# Patient Record
Sex: Female | Born: 1935 | Race: White | Hispanic: No | Marital: Married | Smoking: Never smoker
Health system: Southern US, Community
[De-identification: ages and names within clinical notes are randomized; demographics above are authoritative.]

## PROBLEM LIST (undated history)

## (undated) DIAGNOSIS — E7800 Pure hypercholesterolemia, unspecified: Secondary | ICD-10-CM

## (undated) DIAGNOSIS — I10 Essential (primary) hypertension: Secondary | ICD-10-CM

## (undated) DIAGNOSIS — H8109 Meniere's disease, unspecified ear: Secondary | ICD-10-CM

## (undated) DIAGNOSIS — H579 Unspecified disorder of eye and adnexa: Secondary | ICD-10-CM

## (undated) HISTORY — PX: ABDOMINAL HYSTERECTOMY: SHX81

## (undated) HISTORY — PX: THROAT SURGERY: SHX803

---

## 1997-10-15 ENCOUNTER — Other Ambulatory Visit: Admission: RE | Admit: 1997-10-15 | Discharge: 1997-10-15 | Payer: Self-pay | Admitting: Obstetrics and Gynecology

## 1999-05-16 ENCOUNTER — Encounter: Payer: Self-pay | Admitting: Orthopedic Surgery

## 1999-05-16 ENCOUNTER — Ambulatory Visit (HOSPITAL_COMMUNITY): Admission: RE | Admit: 1999-05-16 | Discharge: 1999-05-16 | Payer: Self-pay | Admitting: Orthopedic Surgery

## 1999-05-24 ENCOUNTER — Encounter: Payer: Self-pay | Admitting: Orthopedic Surgery

## 1999-05-24 ENCOUNTER — Ambulatory Visit (HOSPITAL_COMMUNITY): Admission: RE | Admit: 1999-05-24 | Discharge: 1999-05-24 | Payer: Self-pay | Admitting: Orthopedic Surgery

## 1999-06-07 ENCOUNTER — Encounter: Payer: Self-pay | Admitting: Orthopedic Surgery

## 1999-06-07 ENCOUNTER — Ambulatory Visit (HOSPITAL_COMMUNITY): Admission: RE | Admit: 1999-06-07 | Discharge: 1999-06-07 | Payer: Self-pay | Admitting: Orthopedic Surgery

## 1999-06-21 ENCOUNTER — Ambulatory Visit (HOSPITAL_COMMUNITY): Admission: RE | Admit: 1999-06-21 | Discharge: 1999-06-21 | Payer: Self-pay | Admitting: Orthopedic Surgery

## 1999-06-21 ENCOUNTER — Encounter: Payer: Self-pay | Admitting: Orthopedic Surgery

## 2000-02-10 ENCOUNTER — Other Ambulatory Visit: Admission: RE | Admit: 2000-02-10 | Discharge: 2000-02-10 | Payer: Self-pay | Admitting: Obstetrics and Gynecology

## 2001-11-04 ENCOUNTER — Encounter: Admission: RE | Admit: 2001-11-04 | Discharge: 2001-11-04 | Payer: Self-pay | Admitting: Internal Medicine

## 2001-11-04 ENCOUNTER — Encounter: Payer: Self-pay | Admitting: Internal Medicine

## 2001-11-18 ENCOUNTER — Encounter: Admission: RE | Admit: 2001-11-18 | Discharge: 2001-11-18 | Payer: Self-pay | Admitting: Internal Medicine

## 2001-11-18 ENCOUNTER — Encounter: Payer: Self-pay | Admitting: Internal Medicine

## 2003-02-23 ENCOUNTER — Ambulatory Visit (HOSPITAL_COMMUNITY): Admission: RE | Admit: 2003-02-23 | Discharge: 2003-02-23 | Payer: Self-pay | Admitting: Gastroenterology

## 2003-11-03 ENCOUNTER — Ambulatory Visit (HOSPITAL_COMMUNITY): Admission: RE | Admit: 2003-11-03 | Discharge: 2003-11-03 | Payer: Self-pay | Admitting: Internal Medicine

## 2005-12-23 ENCOUNTER — Emergency Department (HOSPITAL_COMMUNITY): Admission: EM | Admit: 2005-12-23 | Discharge: 2005-12-24 | Payer: Self-pay | Admitting: Emergency Medicine

## 2007-11-25 ENCOUNTER — Emergency Department (HOSPITAL_COMMUNITY): Admission: EM | Admit: 2007-11-25 | Discharge: 2007-11-25 | Payer: Self-pay | Admitting: Family Medicine

## 2008-08-03 ENCOUNTER — Emergency Department (HOSPITAL_COMMUNITY): Admission: EM | Admit: 2008-08-03 | Discharge: 2008-08-04 | Payer: Self-pay | Admitting: Emergency Medicine

## 2009-06-17 ENCOUNTER — Emergency Department (HOSPITAL_COMMUNITY): Admission: EM | Admit: 2009-06-17 | Discharge: 2009-06-18 | Payer: Self-pay | Admitting: Emergency Medicine

## 2010-04-04 LAB — DIFFERENTIAL
Basophils Relative: 0 % (ref 0–1)
Eosinophils Absolute: 0 10*3/uL (ref 0.0–0.7)
Eosinophils Relative: 0 % (ref 0–5)
Lymphs Abs: 1.4 10*3/uL (ref 0.7–4.0)
Monocytes Relative: 2 % — ABNORMAL LOW (ref 3–12)
Neutrophils Relative %: 86 % — ABNORMAL HIGH (ref 43–77)

## 2010-04-04 LAB — COMPREHENSIVE METABOLIC PANEL
ALT: 29 U/L (ref 0–35)
AST: 34 U/L (ref 0–37)
Alkaline Phosphatase: 83 U/L (ref 39–117)
CO2: 20 mEq/L (ref 19–32)
Calcium: 9 mg/dL (ref 8.4–10.5)
GFR calc Af Amer: 33 mL/min — ABNORMAL LOW (ref >60)
GFR calc non Af Amer: 27 mL/min — ABNORMAL LOW (ref >60)
Glucose, Bld: 178 mg/dL — ABNORMAL HIGH (ref 70–99)
Potassium: 4.7 mEq/L (ref 3.5–5.1)
Sodium: 136 mEq/L (ref 135–145)
Total Protein: 7.7 g/dL (ref 6.0–8.3)

## 2010-04-04 LAB — CBC
Hemoglobin: 9.8 g/dL — ABNORMAL LOW (ref 12.0–15.0)
MCHC: 34.6 g/dL (ref 30.0–36.0)
RBC: 3.22 MIL/uL — ABNORMAL LOW (ref 3.87–5.11)

## 2010-06-03 NOTE — Op Note (Signed)
NAME:  Olivia Stevenson, Olivia Stevenson                         ACCOUNT NO.:  [id number]   MEDICAL RECORD NO.:  [id number]                   PATIENT TYPE:  AMB   LOCATION:  ENDO                                 FACILITY:  [facility]   PHYSICIAN:  John C. Madilyn Fireman, M.D.                 DATE OF BIRTH:  [birth year]   DATE OF PROCEDURE:  02/23/2003  DATE OF DISCHARGE:                                 OPERATIVE REPORT   PROCEDURE:  Colonoscopy.   INDICATIONS FOR PROCEDURE:  Average risk colon cancer screening in a 77-year-  old patient with no previous screening.   DESCRIPTION OF PROCEDURE:  The patient was placed in the left lateral  decubitus position and placed on the pulse monitor with continuous low-flow  oxygen delivered by nasal cannula.  She was sedated with 62.5 mcg IV  fentanyl and 7 mg IV Versed.  The Olympus video colonoscope was inserted  into the rectum and advanced to the cecum, confirmed by transillumination of  McBurney's point and visualization of the ileocecal valve and appendiceal  orifice.  Prep was excellent.  The cecum, ascending, transverse, descending,  and sigmoid colon all appeared normal with no masses, polyps, diverticula,  or other mucosal abnormalities.  The rectum likewise appeared normal and  retroflexed view of the anus revealed no obvious internal hemorrhoids.  The  scope was then withdrawn and the patient returned to the recovery room in  stable condition.  She tolerated the procedure well and there were no  immediate complications.   IMPRESSION:  Normal screening colonoscopy.   PLAN:  The next colon screening by sigmoidoscopy in five years.                                               John C. Madilyn Fireman, M.D.    [facility]  D:  02/23/2003  T:  02/23/2003  Job:  [id number]   cc:   Antony Madura, M.D.  [address]  [place]  [place] [address]  Fax: [phone]

## 2010-06-20 IMAGING — CR DG HIP (WITH OR WITHOUT PELVIS) 2-3V*L*
3 series · 3 of 3 positions shown · non-contrast
Comparison: None

CLINICAL DATA: History of chronic hip pain

LEFT HIP - COMPLETE 2+ VIEW

[view not recorded (1 of 3)]
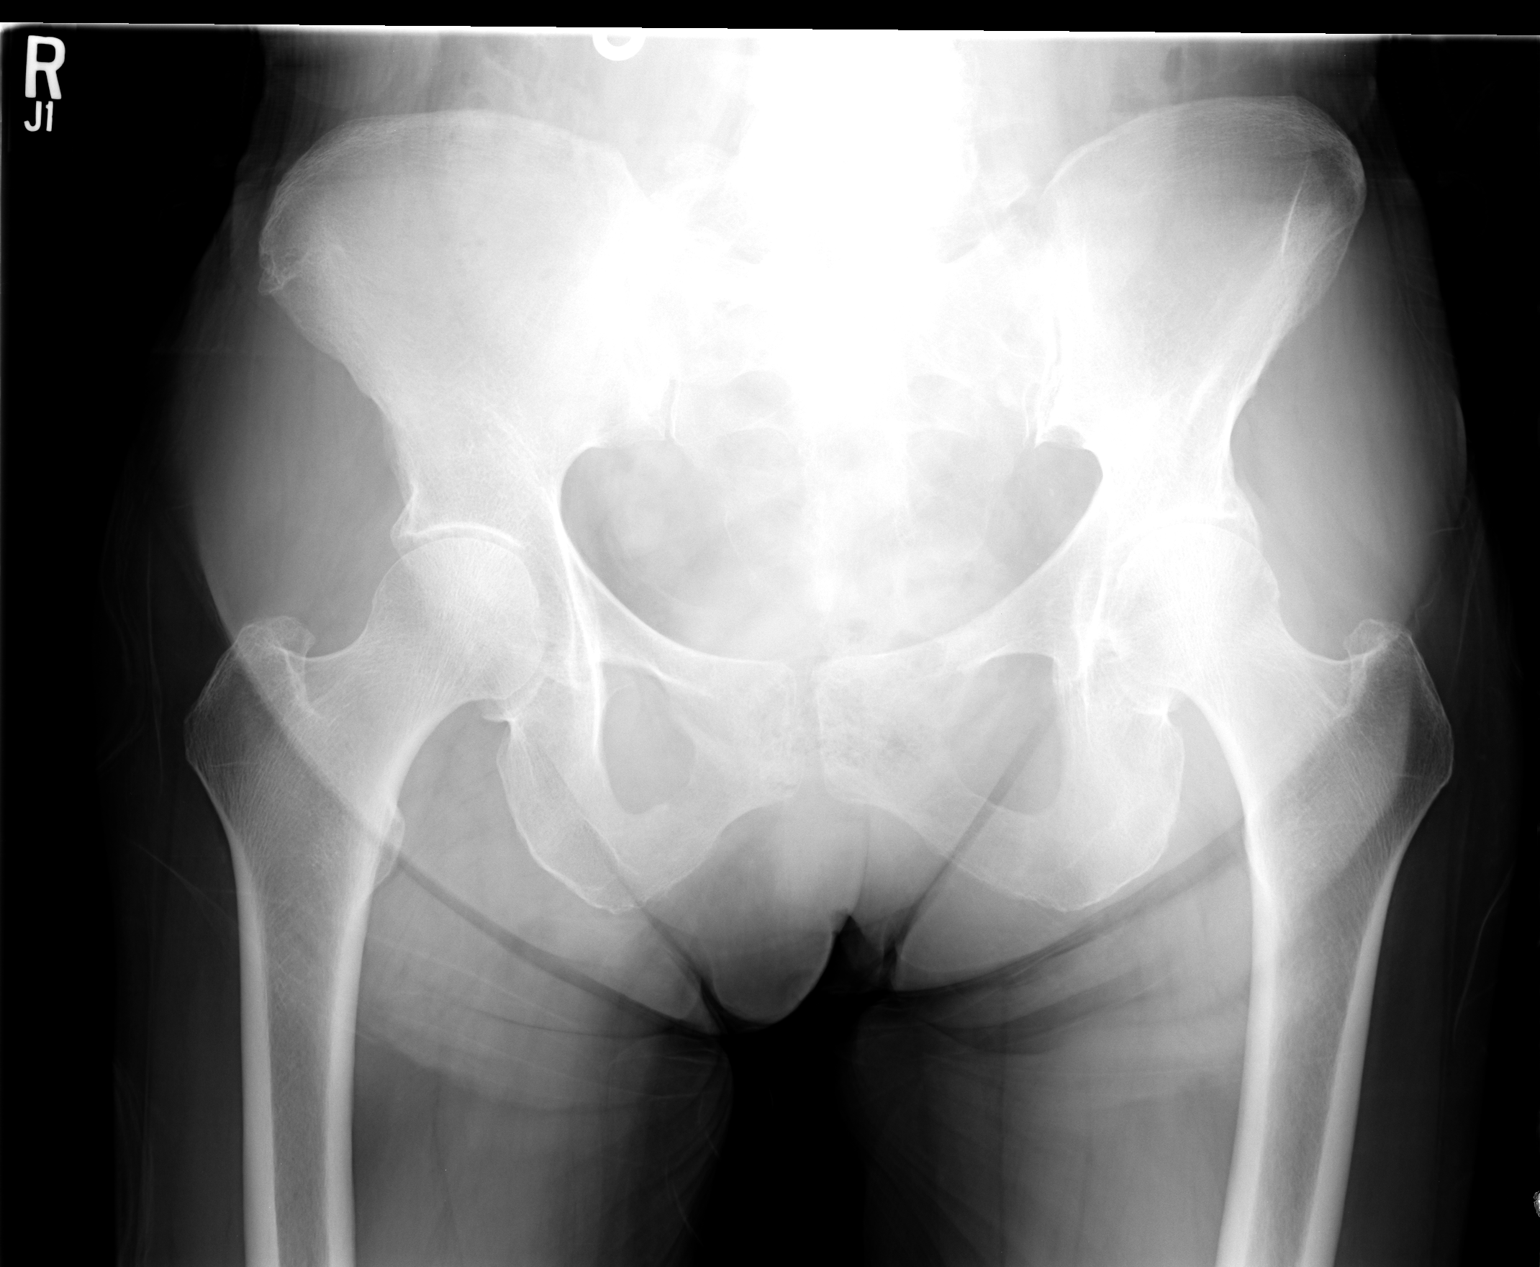

[view not recorded (2 of 3)]
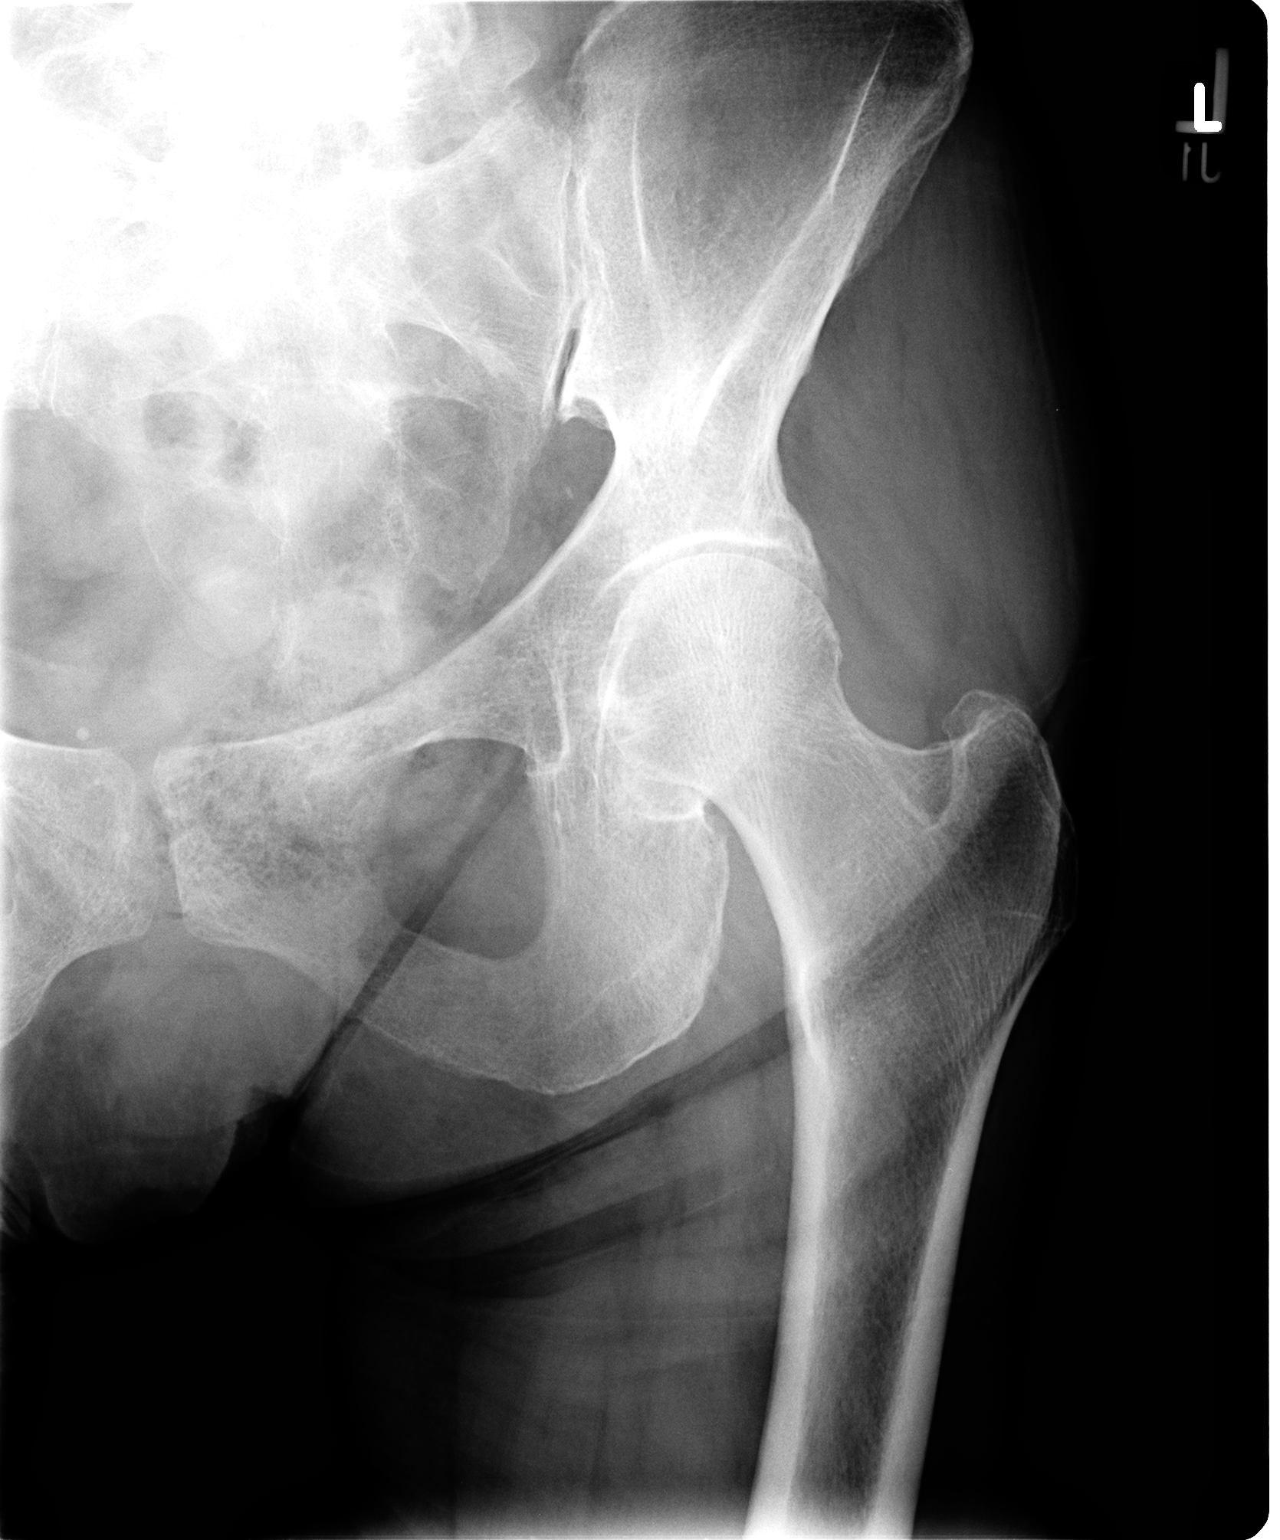

[view not recorded (3 of 3)]
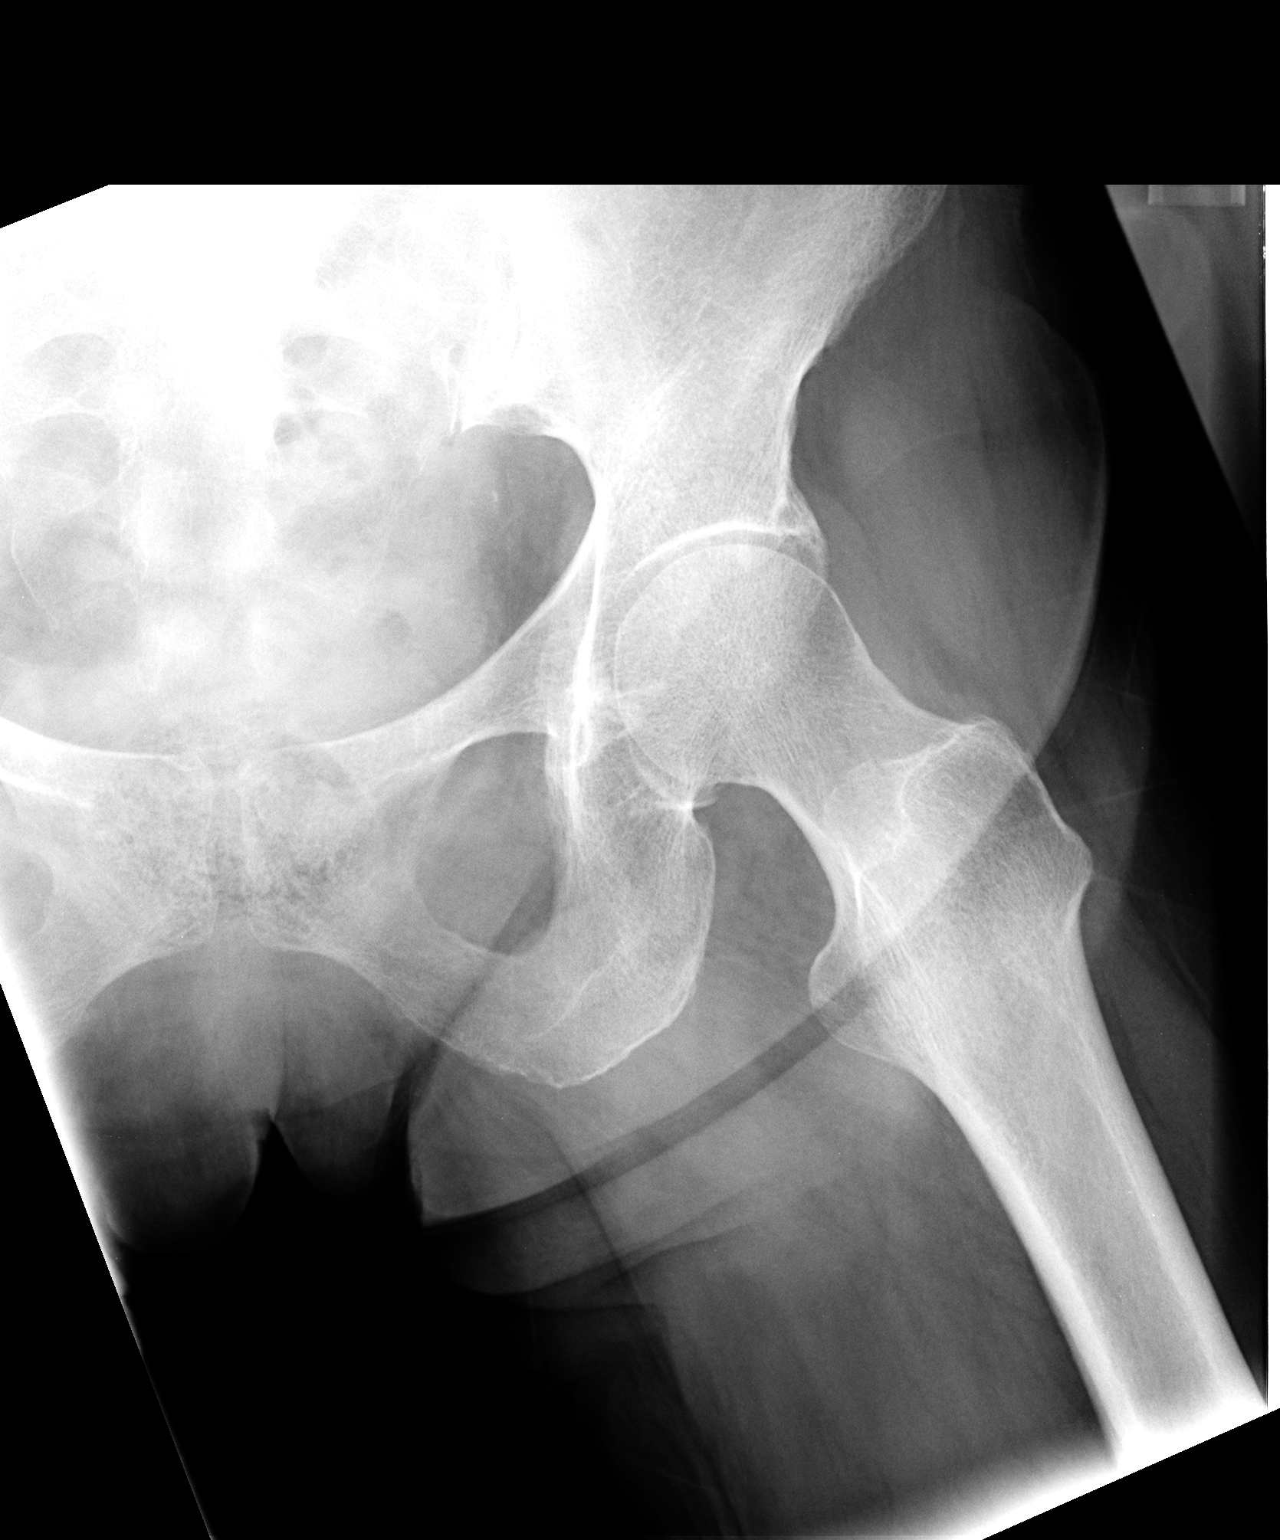

[3 of 3 positions shown; findings below may reference images not displayed]

FINDINGS: There is no fracture or dislocation identified

Mild joint space narrowing and subchondral sclerosis with marginal
spur formation is noted

No radiopaque foreign bodies or soft tissue calcifications noted
IMPRESSION: 1.  Mild left hip osteoarthritis.

## 2015-08-17 DIAGNOSIS — H579 Unspecified disorder of eye and adnexa: Secondary | ICD-10-CM

## 2015-08-17 HISTORY — DX: Unspecified disorder of eye and adnexa: H57.9

## 2015-09-05 ENCOUNTER — Encounter (HOSPITAL_COMMUNITY): Payer: Self-pay

## 2015-09-05 ENCOUNTER — Emergency Department (HOSPITAL_COMMUNITY)
Admission: EM | Admit: 2015-09-05 | Discharge: 2015-09-05 | Disposition: A | Discharge status: Home / Self Care | Payer: Medicare Other | Attending: Emergency Medicine | Admitting: Emergency Medicine

## 2015-09-05 ENCOUNTER — Emergency Department (HOSPITAL_COMMUNITY): Payer: Medicare Other

## 2015-09-05 DIAGNOSIS — R079 Chest pain, unspecified: Secondary | ICD-10-CM | POA: Insufficient documentation

## 2015-09-05 DIAGNOSIS — R55 Syncope and collapse: Secondary | ICD-10-CM | POA: Diagnosis present

## 2015-09-05 DIAGNOSIS — I10 Essential (primary) hypertension: Secondary | ICD-10-CM | POA: Diagnosis not present

## 2015-09-05 HISTORY — DX: Pure hypercholesterolemia, unspecified: E78.00

## 2015-09-05 HISTORY — DX: Meniere's disease, unspecified ear: H81.09

## 2015-09-05 HISTORY — DX: Unspecified disorder of eye and adnexa: H57.9

## 2015-09-05 HISTORY — DX: Essential (primary) hypertension: I10

## 2015-09-05 LAB — URINE MICROSCOPIC-ADD ON

## 2015-09-05 LAB — BASIC METABOLIC PANEL
Anion gap: 6 (ref 5–15)
BUN: 17 mg/dL (ref 6–20)
CO2: 24 mmol/L (ref 22–32)
Calcium: 8.8 mg/dL — ABNORMAL LOW (ref 8.9–10.3)
Chloride: 109 mmol/L (ref 101–111)
Creatinine, Ser: 1.71 mg/dL — ABNORMAL HIGH (ref 0.44–1.00)
GFR calc Af Amer: 31 mL/min — ABNORMAL LOW (ref >60)
GFR calc non Af Amer: 27 mL/min — ABNORMAL LOW (ref >60)
Glucose, Bld: 121 mg/dL — ABNORMAL HIGH (ref 65–99)
Potassium: 4.4 mmol/L (ref 3.5–5.1)
Sodium: 139 mmol/L (ref 135–145)

## 2015-09-05 LAB — CBG MONITORING, ED: Glucose-Capillary: 108 mg/dL — ABNORMAL HIGH (ref 65–99)

## 2015-09-05 LAB — CBC
HCT: 29.8 % — ABNORMAL LOW (ref 36.0–46.0)
Hemoglobin: 9.5 g/dL — ABNORMAL LOW (ref 12.0–15.0)
MCH: 31.4 pg (ref 26.0–34.0)
MCHC: 31.9 g/dL (ref 30.0–36.0)
MCV: 98.3 fL (ref 78.0–100.0)
Platelets: 168 10*3/uL (ref 150–400)
RBC: 3.03 MIL/uL — ABNORMAL LOW (ref 3.87–5.11)
RDW: 14.3 % (ref 11.5–15.5)
WBC: 10.8 10*3/uL — ABNORMAL HIGH (ref 4.0–10.5)

## 2015-09-05 LAB — I-STAT TROPONIN, ED: Troponin i, poc: 0.01 ng/mL (ref 0.00–0.08)

## 2015-09-05 LAB — URINALYSIS, ROUTINE W REFLEX MICROSCOPIC
Bilirubin Urine: NEGATIVE
Glucose, UA: NEGATIVE mg/dL
Hgb urine dipstick: NEGATIVE
Ketones, ur: NEGATIVE mg/dL
Nitrite: NEGATIVE
Protein, ur: 30 mg/dL — AB
Specific Gravity, Urine: 1.015 (ref 1.005–1.030)
pH: 6.5 (ref 5.0–8.0)

## 2015-09-05 MED ORDER — SODIUM CHLORIDE 0.9 % IV BOLUS (SEPSIS)
1000.0000 mL | Freq: Once | INTRAVENOUS | Status: AC
  Administered 2015-09-05: 1000 mL via INTRAVENOUS

## 2015-09-05 NOTE — ED Notes (Signed)
Ambulated Pt. Pt reported feeling great. No assistance needed. When the Pt was back in her hospital bed she reported chest pain, but claimed it was from the CPR.

## 2015-09-05 NOTE — ED Provider Notes (Signed)
[facility] DEPT Provider Note   CSN: [id number] Arrival date & time: 09/05/15  1226     History   Chief Complaint Chief Complaint  Patient presents with  . Loss of Consciousness    HPI Olivia Stevenson is a 82 y.o. female.  Patient presents today with a chief complaint of syncope.  She states that she was at a flea market earlier today walking around in the heat when she felt very warm.  She then went over to one of the medical tents and sat down.  One of the nurse's at the medical tent reports that the patient's eyes then began to roll in the back of her head and she loss consciousness.  The staff there then felt like the patient stopped breathing so they administered CPR.  I spoke with the Nurse on the phone and she states that the patient DID have a pulse at the time of CPR administration.  She states that only a few compressions were administered and then the patient began breathing and so it was stopped.  Pain is currently complaining of some sternal chest pain in the area of where the compressions were administered.  She reports that she did not have any chest pain prior to the syncopal episode.  She denies SOB, fever, chills, nausea, vomiting, vision changes, dizziness, abdominal pain, facial asymmetry, or any other symptoms at this time.  Family reports that there was not any seizure activity associated with this episode.  Family also report that they did not notice any confusion.  She denies any prior history of Cardiac Disease.  Denies history of PE or DVT.  Denies prolonged travel or surgeries in the past 4 weeks.  Denies exogenous estrogen use.  Denies LE edema.        Past Medical History:  Diagnosis Date  . Eye disorder 08/2015   blood behind left eye  . Hypercholesteremia   . Hypertension   . Meniere disease     There are no active problems to display for this patient.   Past Surgical History:  Procedure Laterality Date  . ABDOMINAL HYSTERECTOMY    . CESAREAN  SECTION    . THROAT SURGERY     polpys on vocal cords    OB History    No data available       Home Medications    Prior to Admission medications   Medication Sig Start Date End Date Taking? Authorizing Provider  amLODipine (NORVASC) 2.5 MG tablet Take 2.5 mg by mouth daily.   Yes Historical Provider, MD  lovastatin (MEVACOR) 20 MG tablet Take 20 mg by mouth daily.   Yes Historical Provider, MD    Family History History reviewed. No pertinent family history.  Social History Social History  Substance Use Topics  . Smoking status: Never Smoker  . Smokeless tobacco: Never Used  . Alcohol use No     Allergies   Codeine   Review of Systems Review of Systems  All other systems reviewed and are negative.    Physical Exam Updated Vital Signs There were no vitals taken for this visit.  Physical Exam  Constitutional: She is oriented to person, place, and time. She appears well-developed and well-nourished.  HENT:  Head: Normocephalic and atraumatic.  Mouth/Throat: Oropharynx is clear and moist.  Eyes: EOM are normal. Pupils are equal, round, and reactive to light.  Neck: Normal range of motion. Neck supple.  Cardiovascular: Normal rate, regular rhythm and normal heart sounds.   Pulmonary/Chest:  Effort normal and breath sounds normal. She exhibits tenderness.  Abdominal: Soft. There is no tenderness.  Musculoskeletal: Normal range of motion.  Neurological: She is alert and oriented to person, place, and time. She has normal strength. No cranial nerve deficit or sensory deficit. Coordination and gait normal.  Normal gait, no ataxia  Skin: Skin is warm and dry.  Psychiatric: She has a normal mood and affect.  Nursing note and vitals reviewed.    ED Treatments / Results  Labs (all labs ordered are listed, but only abnormal results are displayed) Labs Reviewed  BASIC METABOLIC PANEL  CBC  URINALYSIS, ROUTINE W REFLEX MICROSCOPIC (NOT AT [facility])  CBG MONITORING,  ED    EKG  EKG Interpretation  Date/Time:  Sunday September 05 2015 12:42:14 EDT Ventricular Rate:  62 PR Interval:    QRS Duration: 89 QT Interval:  463 QTC Calculation: 471 R Axis:   6 Text Interpretation:  Sinus rhythm Anteroseptal infarct, age indeterminate Confirmed by Wilson Singer  MD, [place] ([id number]) on 09/05/2015 12:49:07 PM       Radiology No results found.  Procedures Procedures (including critical care time)  Medications Ordered in ED Medications - No data to display   Initial Impression / Assessment and Plan / ED Course  I have reviewed the triage vital signs and the nursing notes.  Pertinent labs & imaging results that were available during my care of the patient were reviewed by me and considered in my medical decision making (see chart for details).  Clinical Course     Final Clinical Impressions(s) / ED Diagnoses   Final diagnoses:  None   Patient presents today after a syncopal episode that occurred after she had been at the flea market walking around in the extreme heat.  No seizure activity associated with the episode.  She did not fall or hit her head when she had the syncopal episode.   Incident did occur at a medical tent.  She was administered CPR at the medical tent because they thought she stopped breathing for a few seconds.  Spoke with the nurse from the medical tent on the phone and she reports that the patient did have a pulse at the time that CPR was delivered, but that the CPR was given because the patient stopped breathing.  Therefore, CPR was not actually indicated.  CXR is negative.  No ischemic changes on EKG.  Labs unremarkable.  NO risk factors, aside from age, for PE.  She denies any SOB that would indicate PE.  NO CP prior to CPR.  No tachycardia or hypoxia.  Therefore, feel that PE is unlikely.  Labs are unremarkable.  Normal neurological exam.  No signs of head trauma.  Patient wishes to be discharged home.  Feel that patient is stable for  discharge.  Return precautions given.  Patient also evaluated by Dr Wilson Singer who is in agreement with the plan.   New Prescriptions New Prescriptions   No medications on file     Hyman Bible, PA-C 09/09/15 1438    Virgel Manifold, MD 09/28/15 1702

## 2015-09-05 NOTE — ED Notes (Signed)
Patient transported to X-ray 

## 2015-09-05 NOTE — ED Triage Notes (Signed)
To room via EMS.  Pt was at flea market, got hot, sat down on gravel.  Bystanders started CPR.  Not sure how long CPR was performed.  When daughters came to pts side she was alert and sweaty.  Pt A&Ox4. C/o chest soreness.  EMS BP laying 126 palpable HR 60, sitting up 100/60 HR 77.  Last BP laying down 128/61 HR 60.  No c/o dizziness.

## 2015-09-13 ENCOUNTER — Encounter (INDEPENDENT_AMBULATORY_CARE_PROVIDER_SITE_OTHER): Payer: Medicare Other | Admitting: Ophthalmology

## 2015-09-13 DIAGNOSIS — I10 Essential (primary) hypertension: Secondary | ICD-10-CM | POA: Diagnosis not present

## 2015-09-13 DIAGNOSIS — H2513 Age-related nuclear cataract, bilateral: Secondary | ICD-10-CM | POA: Diagnosis not present

## 2015-09-13 DIAGNOSIS — D3131 Benign neoplasm of right choroid: Secondary | ICD-10-CM | POA: Diagnosis not present

## 2015-09-13 DIAGNOSIS — H34812 Central retinal vein occlusion, left eye, with macular edema: Secondary | ICD-10-CM | POA: Diagnosis not present

## 2015-09-13 DIAGNOSIS — H35033 Hypertensive retinopathy, bilateral: Secondary | ICD-10-CM | POA: Diagnosis not present

## 2015-09-13 DIAGNOSIS — H43813 Vitreous degeneration, bilateral: Secondary | ICD-10-CM | POA: Diagnosis not present

## 2015-09-13 DIAGNOSIS — H4312 Vitreous hemorrhage, left eye: Secondary | ICD-10-CM

## 2015-09-23 ENCOUNTER — Other Ambulatory Visit (HOSPITAL_COMMUNITY): Payer: Self-pay | Admitting: Internal Medicine

## 2015-09-23 DIAGNOSIS — I639 Cerebral infarction, unspecified: Secondary | ICD-10-CM

## 2015-09-24 ENCOUNTER — Ambulatory Visit (HOSPITAL_COMMUNITY)
Admission: RE | Admit: 2015-09-24 | Discharge: 2015-09-24 | Disposition: A | Discharge status: Home / Self Care | Payer: Medicare Other | Source: Ambulatory Visit | Attending: Internal Medicine | Admitting: Internal Medicine

## 2015-09-24 DIAGNOSIS — I6529 Occlusion and stenosis of unspecified carotid artery: Secondary | ICD-10-CM | POA: Insufficient documentation

## 2015-09-24 DIAGNOSIS — I639 Cerebral infarction, unspecified: Secondary | ICD-10-CM | POA: Diagnosis not present

## 2015-09-24 NOTE — Progress Notes (Signed)
Preliminary results by tech -   Carotid Duplex Completed. Mild calcified plaque noted in bilateral ICAs without stenosis. Vertebral arteries demonstrated antegrade flow.  Oda Cogan, BS, RDMS, RVT

## 2015-09-25 LAB — VAS US CAROTID
LCCADSYS: -86 cm/s
LEFT ECA DIAS: -10 cm/s
LEFT VERTEBRAL DIAS: -14 cm/s
LICADDIAS: -30 cm/s
LICADSYS: -84 cm/s
LICAPDIAS: 22 cm/s
Left CCA dist dias: -20 cm/s
Left CCA prox dias: 18 cm/s
Left CCA prox sys: 85 cm/s
Left ICA prox sys: 89 cm/s
RCCADSYS: -106 cm/s
RIGHT ECA DIAS: -7 cm/s
RIGHT VERTEBRAL DIAS: 11 cm/s
Right CCA prox dias: 24 cm/s
Right CCA prox sys: 83 cm/s

## 2015-10-11 ENCOUNTER — Encounter (INDEPENDENT_AMBULATORY_CARE_PROVIDER_SITE_OTHER): Payer: Medicare Other | Admitting: Ophthalmology

## 2015-10-11 DIAGNOSIS — H35033 Hypertensive retinopathy, bilateral: Secondary | ICD-10-CM | POA: Diagnosis not present

## 2015-10-11 DIAGNOSIS — H2513 Age-related nuclear cataract, bilateral: Secondary | ICD-10-CM | POA: Diagnosis not present

## 2015-10-11 DIAGNOSIS — H43813 Vitreous degeneration, bilateral: Secondary | ICD-10-CM

## 2015-10-11 DIAGNOSIS — I10 Essential (primary) hypertension: Secondary | ICD-10-CM | POA: Diagnosis not present

## 2015-10-11 DIAGNOSIS — H34812 Central retinal vein occlusion, left eye, with macular edema: Secondary | ICD-10-CM

## 2015-10-11 DIAGNOSIS — D3131 Benign neoplasm of right choroid: Secondary | ICD-10-CM | POA: Diagnosis not present

## 2015-11-08 ENCOUNTER — Encounter (INDEPENDENT_AMBULATORY_CARE_PROVIDER_SITE_OTHER): Payer: Medicare Other | Admitting: Ophthalmology

## 2015-11-08 DIAGNOSIS — H2513 Age-related nuclear cataract, bilateral: Secondary | ICD-10-CM

## 2015-11-08 DIAGNOSIS — D3131 Benign neoplasm of right choroid: Secondary | ICD-10-CM | POA: Diagnosis not present

## 2015-11-08 DIAGNOSIS — H43813 Vitreous degeneration, bilateral: Secondary | ICD-10-CM | POA: Diagnosis not present

## 2015-11-08 DIAGNOSIS — H34812 Central retinal vein occlusion, left eye, with macular edema: Secondary | ICD-10-CM | POA: Diagnosis not present

## 2015-11-08 DIAGNOSIS — I10 Essential (primary) hypertension: Secondary | ICD-10-CM

## 2015-11-08 DIAGNOSIS — H35033 Hypertensive retinopathy, bilateral: Secondary | ICD-10-CM

## 2015-12-06 ENCOUNTER — Encounter (INDEPENDENT_AMBULATORY_CARE_PROVIDER_SITE_OTHER): Payer: Medicare Other | Admitting: Ophthalmology

## 2015-12-06 DIAGNOSIS — D3131 Benign neoplasm of right choroid: Secondary | ICD-10-CM

## 2015-12-06 DIAGNOSIS — H34812 Central retinal vein occlusion, left eye, with macular edema: Secondary | ICD-10-CM

## 2015-12-06 DIAGNOSIS — H35033 Hypertensive retinopathy, bilateral: Secondary | ICD-10-CM | POA: Diagnosis not present

## 2015-12-06 DIAGNOSIS — H43813 Vitreous degeneration, bilateral: Secondary | ICD-10-CM | POA: Diagnosis not present

## 2015-12-06 DIAGNOSIS — I10 Essential (primary) hypertension: Secondary | ICD-10-CM | POA: Diagnosis not present

## 2016-01-03 ENCOUNTER — Encounter (INDEPENDENT_AMBULATORY_CARE_PROVIDER_SITE_OTHER): Payer: Medicare Other | Admitting: Ophthalmology

## 2016-01-03 DIAGNOSIS — H35033 Hypertensive retinopathy, bilateral: Secondary | ICD-10-CM | POA: Diagnosis not present

## 2016-01-03 DIAGNOSIS — H34812 Central retinal vein occlusion, left eye, with macular edema: Secondary | ICD-10-CM | POA: Diagnosis not present

## 2016-01-03 DIAGNOSIS — I10 Essential (primary) hypertension: Secondary | ICD-10-CM | POA: Diagnosis not present

## 2016-01-03 DIAGNOSIS — H43813 Vitreous degeneration, bilateral: Secondary | ICD-10-CM

## 2016-01-03 DIAGNOSIS — D3131 Benign neoplasm of right choroid: Secondary | ICD-10-CM | POA: Diagnosis not present

## 2016-01-31 ENCOUNTER — Encounter (INDEPENDENT_AMBULATORY_CARE_PROVIDER_SITE_OTHER): Payer: Medicare Other | Admitting: Ophthalmology

## 2016-01-31 DIAGNOSIS — H35033 Hypertensive retinopathy, bilateral: Secondary | ICD-10-CM

## 2016-01-31 DIAGNOSIS — H34812 Central retinal vein occlusion, left eye, with macular edema: Secondary | ICD-10-CM | POA: Diagnosis not present

## 2016-01-31 DIAGNOSIS — I10 Essential (primary) hypertension: Secondary | ICD-10-CM

## 2016-01-31 DIAGNOSIS — H43813 Vitreous degeneration, bilateral: Secondary | ICD-10-CM

## 2016-01-31 DIAGNOSIS — H2513 Age-related nuclear cataract, bilateral: Secondary | ICD-10-CM | POA: Diagnosis not present

## 2016-01-31 DIAGNOSIS — D3131 Benign neoplasm of right choroid: Secondary | ICD-10-CM | POA: Diagnosis not present

## 2016-02-28 ENCOUNTER — Encounter (INDEPENDENT_AMBULATORY_CARE_PROVIDER_SITE_OTHER): Payer: Medicare Other | Admitting: Ophthalmology

## 2016-02-28 DIAGNOSIS — I10 Essential (primary) hypertension: Secondary | ICD-10-CM

## 2016-02-28 DIAGNOSIS — H2513 Age-related nuclear cataract, bilateral: Secondary | ICD-10-CM

## 2016-02-28 DIAGNOSIS — D3131 Benign neoplasm of right choroid: Secondary | ICD-10-CM | POA: Diagnosis not present

## 2016-02-28 DIAGNOSIS — H35033 Hypertensive retinopathy, bilateral: Secondary | ICD-10-CM | POA: Diagnosis not present

## 2016-02-28 DIAGNOSIS — H34812 Central retinal vein occlusion, left eye, with macular edema: Secondary | ICD-10-CM

## 2016-02-28 DIAGNOSIS — H43813 Vitreous degeneration, bilateral: Secondary | ICD-10-CM | POA: Diagnosis not present

## 2016-03-27 ENCOUNTER — Encounter (INDEPENDENT_AMBULATORY_CARE_PROVIDER_SITE_OTHER): Payer: Medicare Other | Admitting: Ophthalmology

## 2016-03-27 DIAGNOSIS — H35033 Hypertensive retinopathy, bilateral: Secondary | ICD-10-CM | POA: Diagnosis not present

## 2016-03-27 DIAGNOSIS — H43813 Vitreous degeneration, bilateral: Secondary | ICD-10-CM | POA: Diagnosis not present

## 2016-03-27 DIAGNOSIS — H34812 Central retinal vein occlusion, left eye, with macular edema: Secondary | ICD-10-CM

## 2016-03-27 DIAGNOSIS — I10 Essential (primary) hypertension: Secondary | ICD-10-CM | POA: Diagnosis not present

## 2016-03-27 DIAGNOSIS — D3131 Benign neoplasm of right choroid: Secondary | ICD-10-CM

## 2016-04-24 ENCOUNTER — Encounter (INDEPENDENT_AMBULATORY_CARE_PROVIDER_SITE_OTHER): Payer: Medicare Other | Admitting: Ophthalmology

## 2016-04-24 DIAGNOSIS — H35033 Hypertensive retinopathy, bilateral: Secondary | ICD-10-CM | POA: Diagnosis not present

## 2016-04-24 DIAGNOSIS — H2513 Age-related nuclear cataract, bilateral: Secondary | ICD-10-CM | POA: Diagnosis not present

## 2016-04-24 DIAGNOSIS — H34812 Central retinal vein occlusion, left eye, with macular edema: Secondary | ICD-10-CM

## 2016-04-24 DIAGNOSIS — H43813 Vitreous degeneration, bilateral: Secondary | ICD-10-CM | POA: Diagnosis not present

## 2016-04-24 DIAGNOSIS — I10 Essential (primary) hypertension: Secondary | ICD-10-CM

## 2016-04-24 DIAGNOSIS — D3131 Benign neoplasm of right choroid: Secondary | ICD-10-CM

## 2016-05-29 ENCOUNTER — Encounter (INDEPENDENT_AMBULATORY_CARE_PROVIDER_SITE_OTHER): Payer: Medicare Other | Admitting: Ophthalmology

## 2016-05-29 DIAGNOSIS — I10 Essential (primary) hypertension: Secondary | ICD-10-CM

## 2016-05-29 DIAGNOSIS — D3131 Benign neoplasm of right choroid: Secondary | ICD-10-CM

## 2016-05-29 DIAGNOSIS — H34812 Central retinal vein occlusion, left eye, with macular edema: Secondary | ICD-10-CM | POA: Diagnosis not present

## 2016-05-29 DIAGNOSIS — H43813 Vitreous degeneration, bilateral: Secondary | ICD-10-CM | POA: Diagnosis not present

## 2016-05-29 DIAGNOSIS — H35033 Hypertensive retinopathy, bilateral: Secondary | ICD-10-CM

## 2016-07-10 ENCOUNTER — Encounter (INDEPENDENT_AMBULATORY_CARE_PROVIDER_SITE_OTHER): Payer: Medicare Other | Admitting: Ophthalmology

## 2016-07-10 DIAGNOSIS — D3131 Benign neoplasm of right choroid: Secondary | ICD-10-CM | POA: Diagnosis not present

## 2016-07-10 DIAGNOSIS — H34812 Central retinal vein occlusion, left eye, with macular edema: Secondary | ICD-10-CM | POA: Diagnosis not present

## 2016-07-10 DIAGNOSIS — H35033 Hypertensive retinopathy, bilateral: Secondary | ICD-10-CM

## 2016-07-10 DIAGNOSIS — H43813 Vitreous degeneration, bilateral: Secondary | ICD-10-CM | POA: Diagnosis not present

## 2016-07-10 DIAGNOSIS — I10 Essential (primary) hypertension: Secondary | ICD-10-CM

## 2016-08-28 ENCOUNTER — Encounter (INDEPENDENT_AMBULATORY_CARE_PROVIDER_SITE_OTHER): Payer: Medicare Other | Admitting: Ophthalmology

## 2016-08-28 DIAGNOSIS — H35033 Hypertensive retinopathy, bilateral: Secondary | ICD-10-CM | POA: Diagnosis not present

## 2016-08-28 DIAGNOSIS — I10 Essential (primary) hypertension: Secondary | ICD-10-CM | POA: Diagnosis not present

## 2016-08-28 DIAGNOSIS — H43813 Vitreous degeneration, bilateral: Secondary | ICD-10-CM | POA: Diagnosis not present

## 2016-08-28 DIAGNOSIS — H34812 Central retinal vein occlusion, left eye, with macular edema: Secondary | ICD-10-CM | POA: Diagnosis not present

## 2016-08-28 DIAGNOSIS — D3131 Benign neoplasm of right choroid: Secondary | ICD-10-CM | POA: Diagnosis not present

## 2016-10-23 ENCOUNTER — Encounter (INDEPENDENT_AMBULATORY_CARE_PROVIDER_SITE_OTHER): Payer: Medicare Other | Admitting: Ophthalmology

## 2016-10-23 DIAGNOSIS — H35033 Hypertensive retinopathy, bilateral: Secondary | ICD-10-CM

## 2016-10-23 DIAGNOSIS — H34812 Central retinal vein occlusion, left eye, with macular edema: Secondary | ICD-10-CM | POA: Diagnosis not present

## 2016-10-23 DIAGNOSIS — D3131 Benign neoplasm of right choroid: Secondary | ICD-10-CM

## 2016-10-23 DIAGNOSIS — I10 Essential (primary) hypertension: Secondary | ICD-10-CM

## 2016-10-23 DIAGNOSIS — H43813 Vitreous degeneration, bilateral: Secondary | ICD-10-CM | POA: Diagnosis not present

## 2016-10-23 DIAGNOSIS — H2511 Age-related nuclear cataract, right eye: Secondary | ICD-10-CM

## 2016-12-18 ENCOUNTER — Encounter (INDEPENDENT_AMBULATORY_CARE_PROVIDER_SITE_OTHER): Payer: Medicare Other | Admitting: Ophthalmology

## 2016-12-18 DIAGNOSIS — I10 Essential (primary) hypertension: Secondary | ICD-10-CM

## 2016-12-18 DIAGNOSIS — H43813 Vitreous degeneration, bilateral: Secondary | ICD-10-CM

## 2016-12-18 DIAGNOSIS — H2511 Age-related nuclear cataract, right eye: Secondary | ICD-10-CM | POA: Diagnosis not present

## 2016-12-18 DIAGNOSIS — H35033 Hypertensive retinopathy, bilateral: Secondary | ICD-10-CM

## 2016-12-18 DIAGNOSIS — H34812 Central retinal vein occlusion, left eye, with macular edema: Secondary | ICD-10-CM | POA: Diagnosis not present

## 2016-12-18 DIAGNOSIS — D3131 Benign neoplasm of right choroid: Secondary | ICD-10-CM | POA: Diagnosis not present

## 2017-02-12 ENCOUNTER — Encounter (INDEPENDENT_AMBULATORY_CARE_PROVIDER_SITE_OTHER): Payer: Medicare Other | Admitting: Ophthalmology

## 2017-02-12 DIAGNOSIS — D3131 Benign neoplasm of right choroid: Secondary | ICD-10-CM | POA: Diagnosis not present

## 2017-02-12 DIAGNOSIS — H35033 Hypertensive retinopathy, bilateral: Secondary | ICD-10-CM | POA: Diagnosis not present

## 2017-02-12 DIAGNOSIS — H43813 Vitreous degeneration, bilateral: Secondary | ICD-10-CM | POA: Diagnosis not present

## 2017-02-12 DIAGNOSIS — H34812 Central retinal vein occlusion, left eye, with macular edema: Secondary | ICD-10-CM

## 2017-02-12 DIAGNOSIS — I10 Essential (primary) hypertension: Secondary | ICD-10-CM

## 2017-04-09 ENCOUNTER — Encounter (INDEPENDENT_AMBULATORY_CARE_PROVIDER_SITE_OTHER): Payer: Medicare Other | Admitting: Ophthalmology

## 2017-04-09 DIAGNOSIS — H43813 Vitreous degeneration, bilateral: Secondary | ICD-10-CM

## 2017-04-09 DIAGNOSIS — D3131 Benign neoplasm of right choroid: Secondary | ICD-10-CM | POA: Diagnosis not present

## 2017-04-09 DIAGNOSIS — I10 Essential (primary) hypertension: Secondary | ICD-10-CM

## 2017-04-09 DIAGNOSIS — H35033 Hypertensive retinopathy, bilateral: Secondary | ICD-10-CM

## 2017-04-09 DIAGNOSIS — H34812 Central retinal vein occlusion, left eye, with macular edema: Secondary | ICD-10-CM

## 2017-06-04 ENCOUNTER — Encounter (INDEPENDENT_AMBULATORY_CARE_PROVIDER_SITE_OTHER): Payer: Medicare Other | Admitting: Ophthalmology

## 2017-06-04 DIAGNOSIS — I10 Essential (primary) hypertension: Secondary | ICD-10-CM

## 2017-06-04 DIAGNOSIS — H35033 Hypertensive retinopathy, bilateral: Secondary | ICD-10-CM | POA: Diagnosis not present

## 2017-06-04 DIAGNOSIS — D3131 Benign neoplasm of right choroid: Secondary | ICD-10-CM | POA: Diagnosis not present

## 2017-06-04 DIAGNOSIS — H34812 Central retinal vein occlusion, left eye, with macular edema: Secondary | ICD-10-CM | POA: Diagnosis not present

## 2017-06-04 DIAGNOSIS — H43813 Vitreous degeneration, bilateral: Secondary | ICD-10-CM

## 2017-07-30 ENCOUNTER — Encounter (INDEPENDENT_AMBULATORY_CARE_PROVIDER_SITE_OTHER): Payer: Medicare Other | Admitting: Ophthalmology

## 2017-07-30 DIAGNOSIS — H35033 Hypertensive retinopathy, bilateral: Secondary | ICD-10-CM | POA: Diagnosis not present

## 2017-07-30 DIAGNOSIS — H34812 Central retinal vein occlusion, left eye, with macular edema: Secondary | ICD-10-CM | POA: Diagnosis not present

## 2017-07-30 DIAGNOSIS — H43813 Vitreous degeneration, bilateral: Secondary | ICD-10-CM | POA: Diagnosis not present

## 2017-07-30 DIAGNOSIS — I10 Essential (primary) hypertension: Secondary | ICD-10-CM | POA: Diagnosis not present

## 2017-07-30 DIAGNOSIS — D3131 Benign neoplasm of right choroid: Secondary | ICD-10-CM

## 2017-08-06 ENCOUNTER — Other Ambulatory Visit: Payer: Self-pay | Admitting: Internal Medicine

## 2017-08-06 ENCOUNTER — Ambulatory Visit
Admission: RE | Admit: 2017-08-06 | Discharge: 2017-08-06 | Disposition: A | Discharge status: Home / Self Care | Payer: Medicare Other | Source: Ambulatory Visit | Attending: Internal Medicine | Admitting: Internal Medicine

## 2017-08-06 DIAGNOSIS — M25552 Pain in left hip: Principal | ICD-10-CM

## 2017-08-06 DIAGNOSIS — M25551 Pain in right hip: Secondary | ICD-10-CM

## 2017-09-16 DEATH — deceased

## 2017-10-01 ENCOUNTER — Encounter (INDEPENDENT_AMBULATORY_CARE_PROVIDER_SITE_OTHER): Payer: Medicare Other | Admitting: Ophthalmology

## 2018-03-31 IMAGING — DX DG CHEST 2V
2 series · 2 of 2 positions shown · non-contrast
Comparison: None.

CLINICAL DATA: Syncopal episode 2 hours ago. CPR administered.
Chest discomfort.

EXAM:
CHEST  2 VIEW

[chest pa]
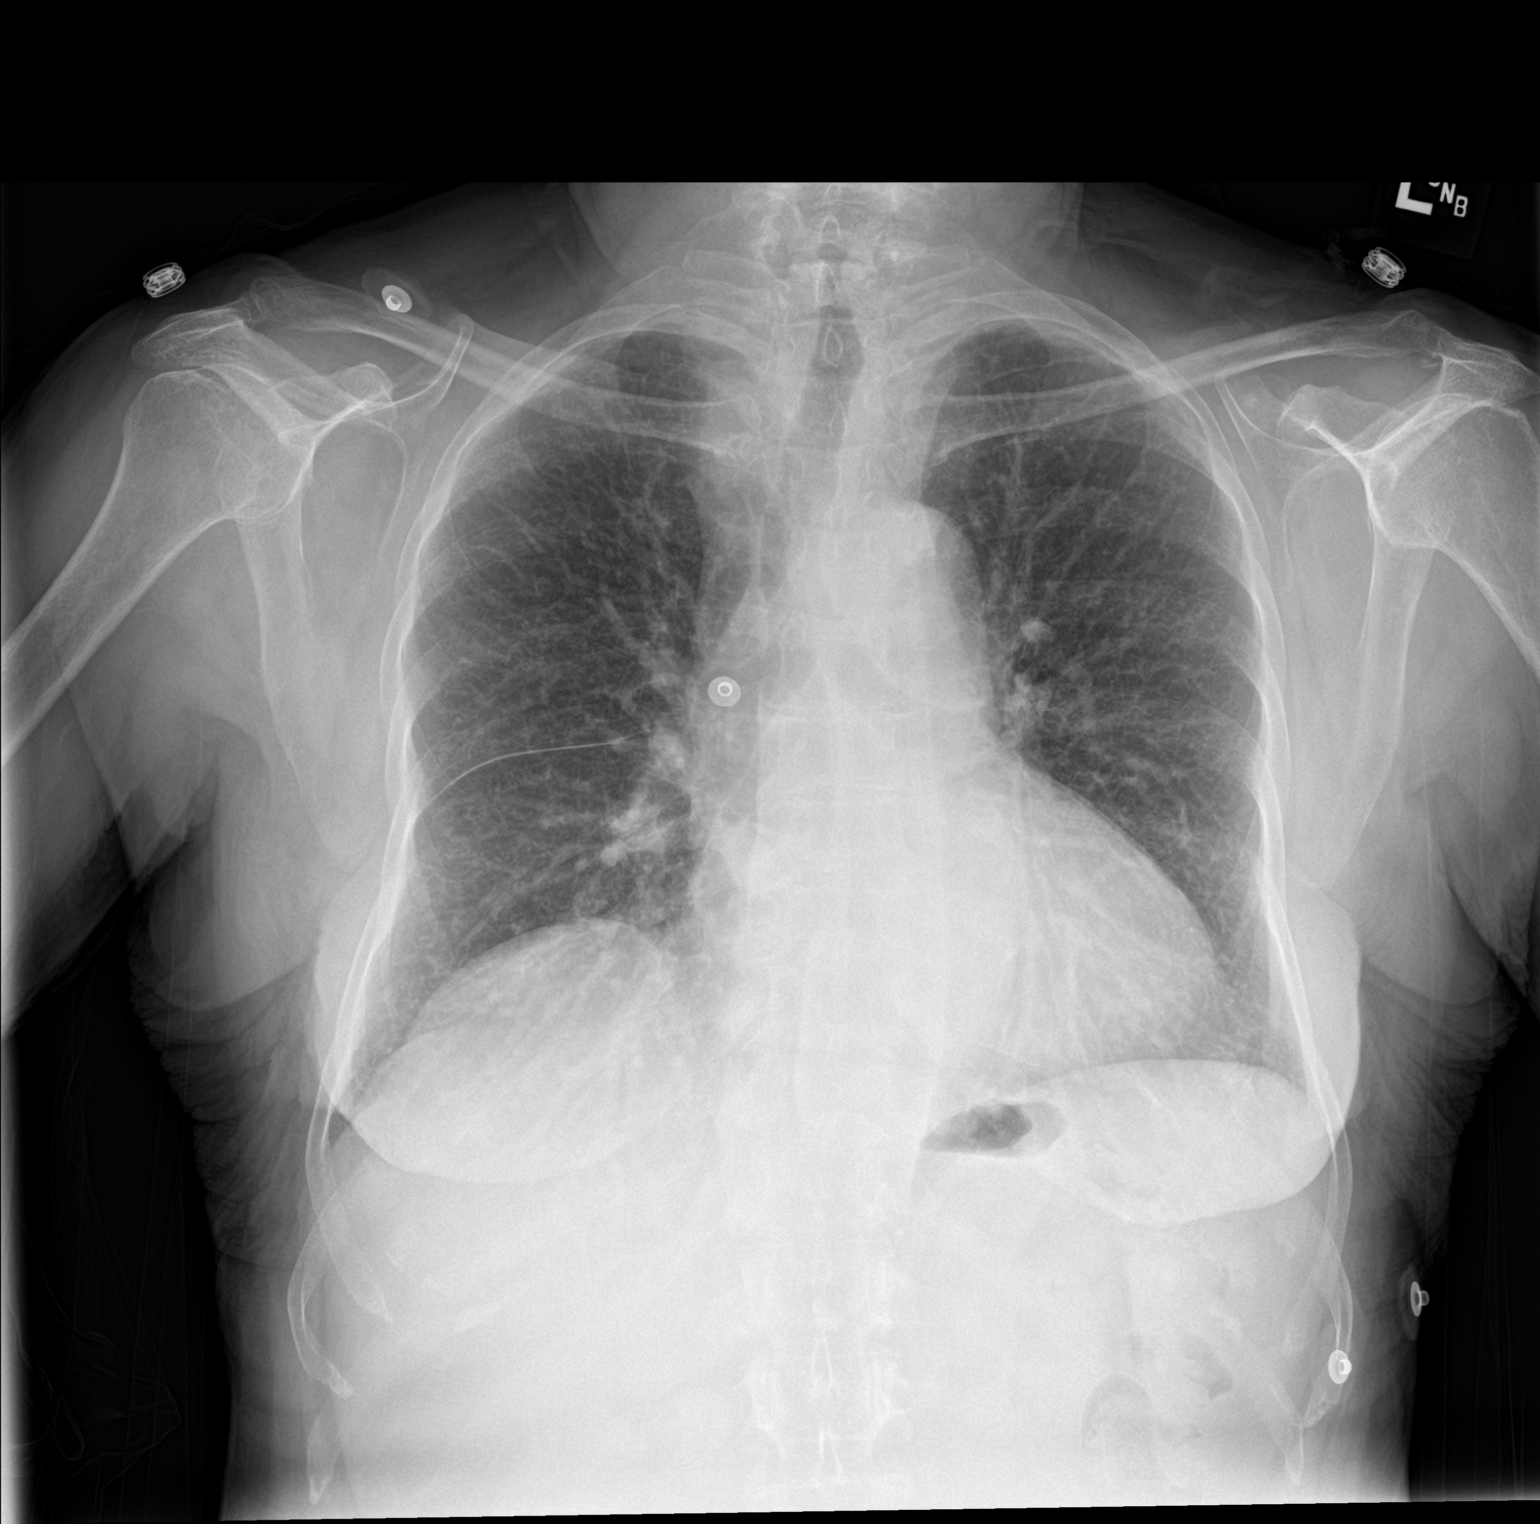

[chest lat]
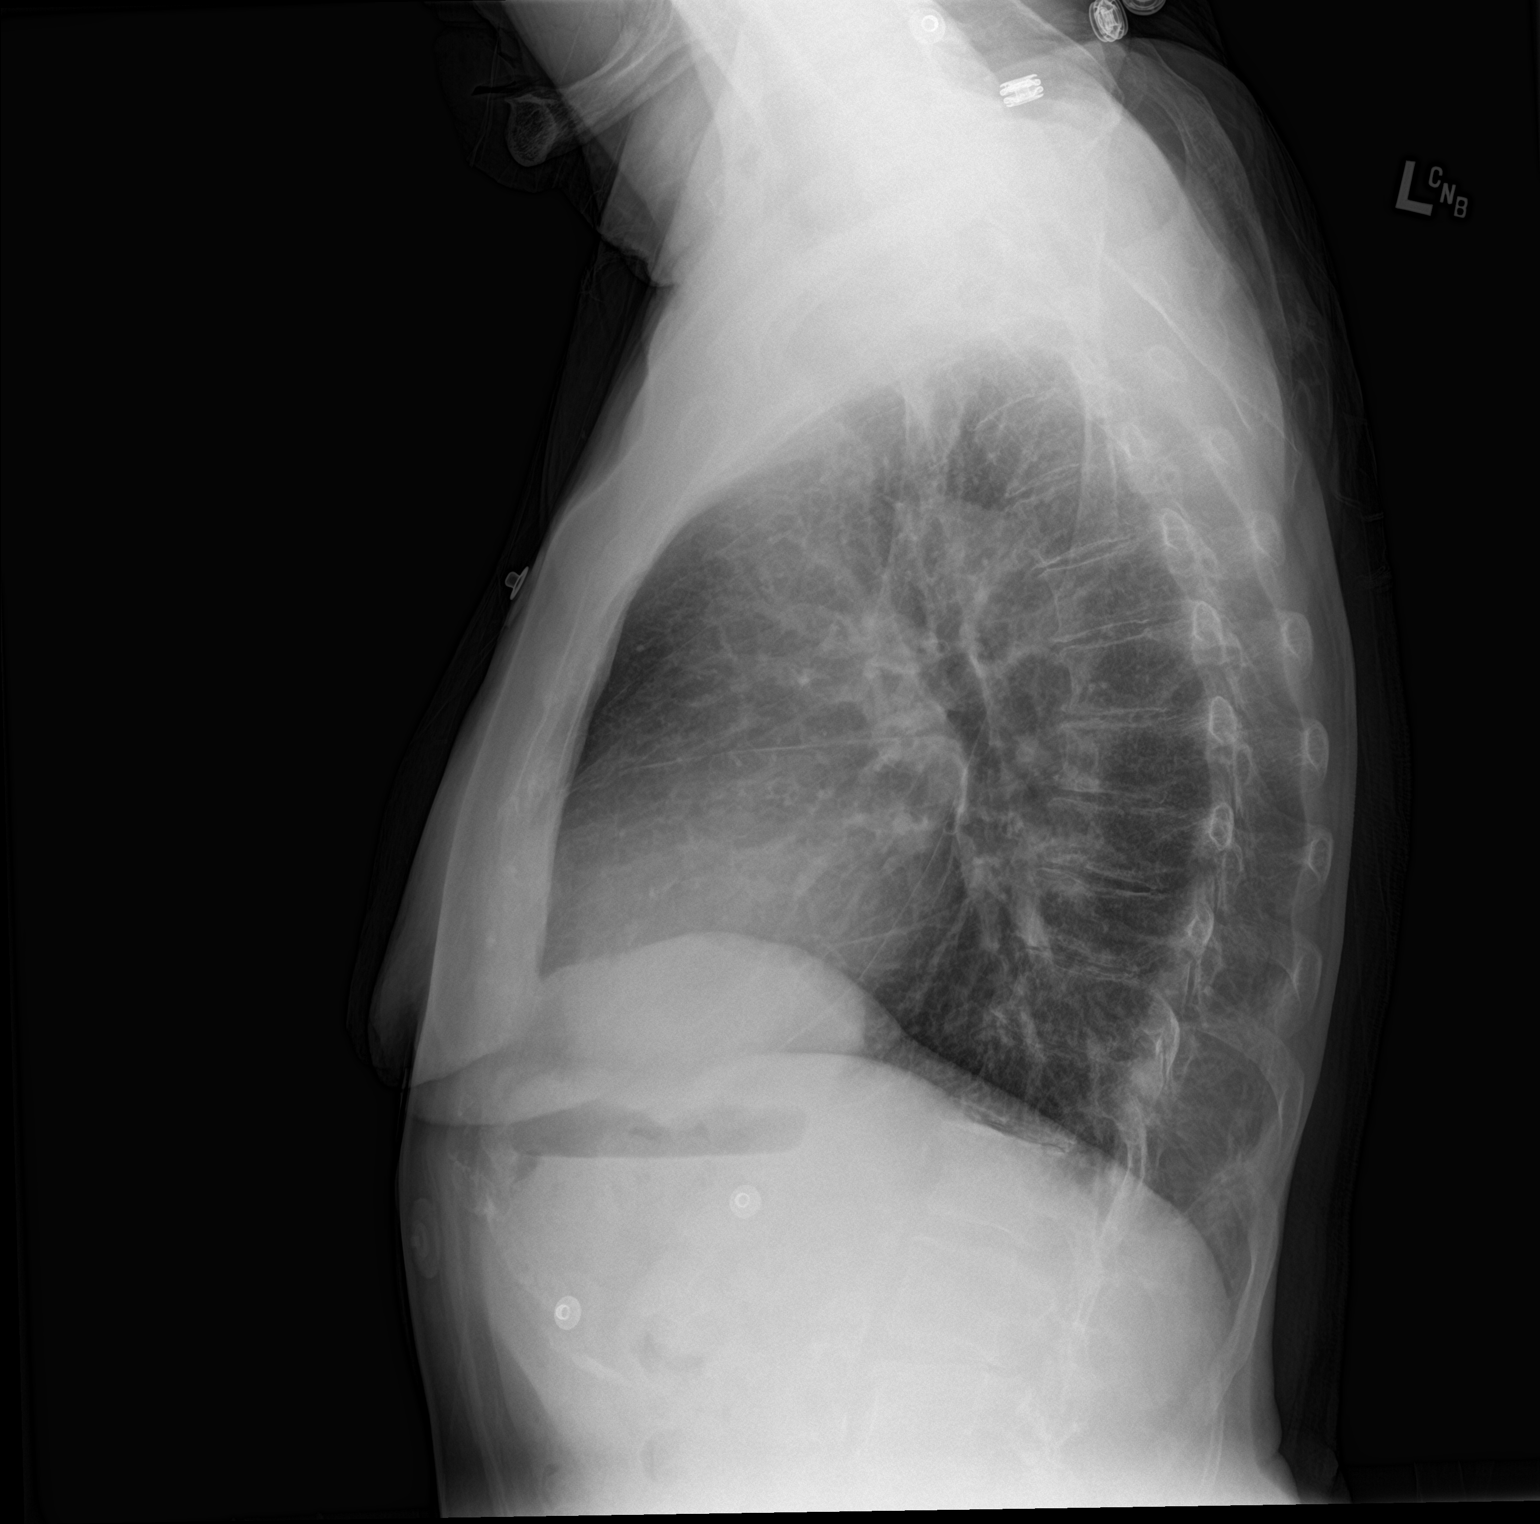

[2 of 2 positions shown; findings below may reference images not displayed]

FINDINGS: Mild cardiac enlargement. Slightly tortuous aorta. The lungs are
clear. Tiny amount of fluid in the fissures. No measurable effusion.
No visible bone abnormality.
IMPRESSION: Mild cardiac enlargement. Small amount of fluid in the fissures.
Otherwise negative study.
